# Patient Record
Sex: Male | Born: 1995 | Race: White | Hispanic: No | Marital: Single | Smoking: Current every day smoker
Health system: Southern US, Community
[De-identification: ages and names within clinical notes are randomized; demographics above are authoritative.]

---

## 2016-06-21 ENCOUNTER — Encounter (HOSPITAL_COMMUNITY): Payer: Self-pay | Admitting: Emergency Medicine

## 2016-06-21 ENCOUNTER — Emergency Department (HOSPITAL_COMMUNITY): Payer: Self-pay

## 2016-06-21 ENCOUNTER — Inpatient Hospital Stay (HOSPITAL_COMMUNITY)
Admission: EM | Admit: 2016-06-21 | Discharge: 2016-06-23 | DRG: 493 | Disposition: A | Discharge status: Home / Self Care | Payer: Self-pay | Attending: Orthopaedic Surgery | Admitting: Orthopaedic Surgery

## 2016-06-21 DIAGNOSIS — Z9889 Other specified postprocedural states: Secondary | ICD-10-CM

## 2016-06-21 DIAGNOSIS — Z79899 Other long term (current) drug therapy: Secondary | ICD-10-CM

## 2016-06-21 DIAGNOSIS — S82221A Displaced transverse fracture of shaft of right tibia, initial encounter for closed fracture: Secondary | ICD-10-CM

## 2016-06-21 DIAGNOSIS — S82831A Other fracture of upper and lower end of right fibula, initial encounter for closed fracture: Secondary | ICD-10-CM | POA: Diagnosis present

## 2016-06-21 DIAGNOSIS — D62 Acute posthemorrhagic anemia: Secondary | ICD-10-CM | POA: Diagnosis not present

## 2016-06-21 DIAGNOSIS — S82402A Unspecified fracture of shaft of left fibula, initial encounter for closed fracture: Secondary | ICD-10-CM | POA: Diagnosis present

## 2016-06-21 DIAGNOSIS — Z888 Allergy status to other drugs, medicaments and biological substances status: Secondary | ICD-10-CM

## 2016-06-21 DIAGNOSIS — Y9351 Activity, roller skating (inline) and skateboarding: Secondary | ICD-10-CM

## 2016-06-21 DIAGNOSIS — S82241A Displaced spiral fracture of shaft of right tibia, initial encounter for closed fracture: Principal | ICD-10-CM | POA: Diagnosis present

## 2016-06-21 DIAGNOSIS — F17210 Nicotine dependence, cigarettes, uncomplicated: Secondary | ICD-10-CM | POA: Diagnosis present

## 2016-06-21 DIAGNOSIS — Z419 Encounter for procedure for purposes other than remedying health state, unspecified: Secondary | ICD-10-CM

## 2016-06-21 LAB — CBC WITH DIFFERENTIAL/PLATELET
BASOS ABS: 0 10*3/uL (ref 0.0–0.1)
BASOS PCT: 0 %
Eosinophils Absolute: 0 10*3/uL (ref 0.0–0.7)
Eosinophils Relative: 0 %
HEMATOCRIT: 37.1 % — AB (ref 39.0–52.0)
HEMOGLOBIN: 13.1 g/dL (ref 13.0–17.0)
Lymphocytes Relative: 12 %
Lymphs Abs: 1.5 10*3/uL (ref 0.7–4.0)
MCH: 29.8 pg (ref 26.0–34.0)
MCHC: 35.3 g/dL (ref 30.0–36.0)
MCV: 84.3 fL (ref 78.0–100.0)
MONO ABS: 0.7 10*3/uL (ref 0.1–1.0)
Monocytes Relative: 6 %
NEUTROS ABS: 10.7 10*3/uL — AB (ref 1.7–7.7)
NEUTROS PCT: 82 %
Platelets: 204 10*3/uL (ref 150–400)
RBC: 4.4 MIL/uL (ref 4.22–5.81)
RDW: 13.2 % (ref 11.5–15.5)
WBC: 12.9 10*3/uL — AB (ref 4.0–10.5)

## 2016-06-21 LAB — BASIC METABOLIC PANEL
ANION GAP: 7 (ref 5–15)
BUN: 15 mg/dL (ref 6–20)
CALCIUM: 8.6 mg/dL — AB (ref 8.9–10.3)
CO2: 25 mmol/L (ref 22–32)
Chloride: 105 mmol/L (ref 101–111)
Creatinine, Ser: 0.96 mg/dL (ref 0.61–1.24)
GFR calc non Af Amer: >60 mL/min (ref >60)
Glucose, Bld: 112 mg/dL — ABNORMAL HIGH (ref 65–99)
Potassium: 3.9 mmol/L (ref 3.5–5.1)
Sodium: 137 mmol/L (ref 135–145)

## 2016-06-21 MED ORDER — OXYCODONE HCL 5 MG PO TABS
5.0000 mg | ORAL_TABLET | ORAL | Status: DC | PRN
  Administered 2016-06-22 (×3): 10 mg via ORAL
  Filled 2016-06-21 (×3): qty 2

## 2016-06-21 MED ORDER — HYDROMORPHONE HCL 1 MG/ML IJ SOLN
1.0000 mg | Freq: Once | INTRAMUSCULAR | Status: AC
Start: 2016-06-21 — End: 2016-06-21
  Administered 2016-06-21: 1 mg via INTRAVENOUS
  Filled 2016-06-21: qty 1

## 2016-06-21 MED ORDER — METHOCARBAMOL 500 MG PO TABS
500.0000 mg | ORAL_TABLET | Freq: Four times a day (QID) | ORAL | Status: DC | PRN
  Administered 2016-06-21 – 2016-06-23 (×4): 500 mg via ORAL
  Filled 2016-06-21 (×3): qty 1

## 2016-06-21 MED ORDER — ACETAMINOPHEN 325 MG PO TABS
650.0000 mg | ORAL_TABLET | Freq: Four times a day (QID) | ORAL | Status: DC | PRN
  Administered 2016-06-22: 650 mg via ORAL
  Filled 2016-06-21: qty 2

## 2016-06-21 MED ORDER — ONDANSETRON HCL 4 MG/2ML IJ SOLN
4.0000 mg | Freq: Once | INTRAMUSCULAR | Status: AC
  Administered 2016-06-21: 4 mg via INTRAVENOUS
  Filled 2016-06-21: qty 2

## 2016-06-21 MED ORDER — METHOCARBAMOL 1000 MG/10ML IJ SOLN
500.0000 mg | Freq: Four times a day (QID) | INTRAVENOUS | Status: DC | PRN
  Filled 2016-06-21: qty 5

## 2016-06-21 MED ORDER — FENTANYL CITRATE (PF) 100 MCG/2ML IJ SOLN
100.0000 ug | Freq: Once | INTRAMUSCULAR | Status: AC
  Administered 2016-06-21: 100 ug via INTRAVENOUS
  Filled 2016-06-21: qty 2

## 2016-06-21 MED ORDER — ACETAMINOPHEN 650 MG RE SUPP: 650.0000 mg | Freq: Four times a day (QID) | RECTAL | Status: DC | PRN

## 2016-06-21 MED ORDER — MORPHINE SULFATE (PF) 2 MG/ML IV SOLN
1.0000 mg | INTRAVENOUS | Status: DC | PRN
  Administered 2016-06-22: 1 mg via INTRAVENOUS
  Filled 2016-06-21: qty 1

## 2016-06-21 MED ORDER — METHOCARBAMOL 500 MG PO TABS
ORAL_TABLET | ORAL | Status: AC
  Filled 2016-06-21: qty 1

## 2016-06-21 NOTE — ED Notes (Signed)
Bed: [id number] Expected date:  Expected time:  Means of arrival:  Comments: 20 yo M/lower leg deformity

## 2016-06-21 NOTE — ED Provider Notes (Signed)
[facility]-EMERGENCY DEPT Provider Note   CSN: [id number] Arrival date & time: 06/21/16  2132     History   Chief Complaint Chief Complaint  Patient presents with  . Leg Injury    HPI Luis Vega is a 20 y.o. male.  20 yo M with a chief complaint of right ankle pain. This occurred when the patient tried to do a trick jumping on a skateboard [place] A-frame. He landed on the lateral aspect of the skateboard and inverted his right ankle. He then fell to his knees. Noted a deformity to the ankle. Had some swelling to the area as well. Called 911.   The history is provided by the patient.  Injury  This is a new problem. The current episode started less than 1 hour ago. The problem occurs constantly. The problem has not changed since onset.Pertinent negatives include no chest pain, no abdominal pain, no headaches and no shortness of breath. The symptoms are aggravated by bending, twisting, coughing and walking. Nothing relieves the symptoms. He has tried nothing for the symptoms. The treatment provided no relief.    History reviewed. No pertinent past medical history.  Patient Active Problem List   Diagnosis Date Noted  . Displaced spiral fracture of shaft of right tibia, initial encounter for closed fracture 06/21/2016    History reviewed. No pertinent surgical history.     Home Medications    Prior to Admission medications   Medication Sig Start Date End Date Taking? Authorizing Provider  sodium chloride (OCEAN) 0.65 % SOLN nasal spray Place 1 spray into both nostrils daily.   Yes Historical Provider, MD    Family History History reviewed. No pertinent family history.  Social History Social History  Substance Use Topics  . Smoking status: Current Every Day Smoker    Types: Cigarettes  . Smokeless tobacco: Never Used  . Alcohol use Yes     Allergies   Other   Review of Systems Review of Systems  Constitutional: Negative for chills and fever.  HENT: Negative for  congestion and facial swelling.   Eyes: Negative for discharge and visual disturbance.  Respiratory: Negative for shortness of breath.   Cardiovascular: Negative for chest pain and palpitations.  Gastrointestinal: Negative for abdominal pain, diarrhea and vomiting.  Musculoskeletal: Positive for arthralgias and myalgias.  Skin: Negative for color change and rash.  Neurological: Negative for tremors, syncope and headaches.  Psychiatric/Behavioral: Negative for confusion and dysphoric mood.     Physical Exam Updated Vital Signs BP 139/93   Pulse (!) 47   Ht 5\' 9"  (1.753 m)   Wt 135 lb (61.2 kg)   SpO2 100%   BMI 19.94 kg/m   Physical Exam  Constitutional: He is oriented to person, place, and time. He appears well-developed and well-nourished.  HENT:  Head: Normocephalic and atraumatic.  Eyes: EOM are normal. Pupils are equal, round, and reactive to light.  Neck: Normal range of motion. Neck supple. No JVD present.  Cardiovascular: Normal rate and regular rhythm.  Exam reveals no gallop and no friction rub.   No murmur heard. Pulmonary/Chest: No respiratory distress. He has no wheezes.  Abdominal: He exhibits no distension. There is no rebound and no guarding.  Musculoskeletal: Normal range of motion. He exhibits edema, tenderness and deformity.  Crepitus noted to the distal fibula. Patient with a externally rotated foot compared to the lower leg. Pulse motor and sensation is intact distally. Some edema about the ankle. Patient with pain upon compressing the fibular head  to the tibia. Palpated from head to toe with no other noted injuries.  Neurological: He is alert and oriented to person, place, and time.  Skin: No rash noted. No pallor.  Psychiatric: He has a normal mood and affect. His behavior is normal.  Nursing note and vitals reviewed.    ED Treatments / Results  Labs (all labs ordered are listed, but only abnormal results are displayed) Labs Reviewed  CBC WITH  DIFFERENTIAL/PLATELET - Abnormal; Notable for the following:       Result Value   WBC 12.9 (*)    HCT 37.1 (*)    Neutro Abs 10.7 (*)    All other components within normal limits  BASIC METABOLIC PANEL    EKG  EKG Interpretation None       Radiology Dg Tibia/fibula Right  Result Date: 06/21/2016 CLINICAL DATA:  Initial evaluation for acute trauma, skateboarding accident. Pain and swelling at lower leg. EXAM: RIGHT TIBIA AND FIBULA - 2 VIEW COMPARISON:  None. FINDINGS: There are is an acute oblique fracture through the distal tibial shaft. Lateral displacement of nearly 1 shaft width. No significant angulation. Additional acute oblique fracture through the distal fibular shaft with lateral displacement. Overlying soft tissue swelling.  No soft tissue emphysema. IMPRESSION: Acute oblique fractures through the distal tibial and fibular shafts with lateral displacement. Electronically Signed   By: Rise MuBenjamin  McClintock M.D.   On: 06/21/2016 22:35   Dg Ankle Complete Right  Result Date: 06/21/2016 CLINICAL DATA:  Initial evaluation for acute trauma, skateboarding accident. EXAM: RIGHT ANKLE - COMPLETE 3+ VIEW COMPARISON:  Comparison made with concomitant radiograph of the right tibia and fibula. FINDINGS: There is an acute oblique fracture through the distal tibial shaft with lateral displacement. Additional acute oblique fracture through the distal fibular shaft with lateral displacement. Ankle mortise remains approximated. Talar dome intact. No dislocation about the ankle self. Associated soft tissue swelling present within the lower leg he and about the ankle. IMPRESSION: 1. Acute oblique fracture through the distal tibial and fibular shaft with lateral displacement. 2. No other acute fracture about the ankle. Ankle joint remains approximated. Electronically Signed   By: Rise MuBenjamin  McClintock M.D.   On: 06/21/2016 22:37    Procedures Procedures (including critical care time)  Medications  Ordered in ED Medications  acetaminophen (TYLENOL) tablet 650 mg (not administered)    Or  acetaminophen (TYLENOL) suppository 650 mg (not administered)  oxyCODONE (Oxy IR/ROXICODONE) immediate release tablet 5-10 mg (not administered)  morphine 2 MG/ML injection 1 mg (not administered)  methocarbamol (ROBAXIN) tablet 500 mg (not administered)    Or  methocarbamol (ROBAXIN) 500 mg in dextrose 5 % 50 mL IVPB (not administered)  methocarbamol (ROBAXIN) 500 MG tablet (not administered)  ondansetron (ZOFRAN) injection 4 mg (4 mg Intravenous Given 06/21/16 2155)  fentaNYL (SUBLIMAZE) injection 100 mcg (100 mcg Intravenous Given 06/21/16 2155)  fentaNYL (SUBLIMAZE) injection 100 mcg (100 mcg Intravenous Given 06/21/16 2236)  HYDROmorphone (DILAUDID) injection 1 mg (1 mg Intravenous Given 06/21/16 2312)     Initial Impression / Assessment and Plan / ED Course  I have reviewed the triage vital signs and the nursing notes.  Pertinent labs & imaging results that were available during my care of the patient were reviewed by me and considered in my medical decision making (see chart for details).  Clinical Course     20 yo M With a chief complaint of right ankle pain. This appears to clinically be broken. Will obtain a plain  film of the ankle and tib-fib.  Patient with a distal tibial fracture in distal fibular fracture. Discussed with Dr. Roda ShuttersXu, ortho.  Recommended that we put the patient in a splint and he will admit. OR tomorrow.  The patients results and plan were reviewed and discussed.   Any x-rays performed were independently reviewed by myself.   Differential diagnosis were considered with the presenting HPI.  Medications  acetaminophen (TYLENOL) tablet 650 mg (not administered)    Or  acetaminophen (TYLENOL) suppository 650 mg (not administered)  oxyCODONE (Oxy IR/ROXICODONE) immediate release tablet 5-10 mg (not administered)  morphine 2 MG/ML injection 1 mg (not administered)    methocarbamol (ROBAXIN) tablet 500 mg (not administered)    Or  methocarbamol (ROBAXIN) 500 mg in dextrose 5 % 50 mL IVPB (not administered)  methocarbamol (ROBAXIN) 500 MG tablet (not administered)  ondansetron (ZOFRAN) injection 4 mg (4 mg Intravenous Given 06/21/16 2155)  fentaNYL (SUBLIMAZE) injection 100 mcg (100 mcg Intravenous Given 06/21/16 2155)  fentaNYL (SUBLIMAZE) injection 100 mcg (100 mcg Intravenous Given 06/21/16 2236)  HYDROmorphone (DILAUDID) injection 1 mg (1 mg Intravenous Given 06/21/16 2312)    Vitals:   06/21/16 2139 06/21/16 2200 06/21/16 2230  BP:  127/84 139/93  Pulse:  (!) 52 (!) 47  SpO2:  99% 100%  Weight: 135 lb (61.2 kg)    Height: 5\' 9"  (1.753 m)      Final diagnoses:  Closed displaced transverse fracture of shaft of right tibia, initial encounter    Admission/ observation were discussed with the admitting physician, patient and/or family and they are comfortable with the plan.    Final Clinical Impressions(s) / ED Diagnoses   Final diagnoses:  Closed displaced transverse fracture of shaft of right tibia, initial encounter    New Prescriptions New Prescriptions   No medications on file     Melene PlanDan Zanita Millman, DO 06/21/16 2324

## 2016-06-21 NOTE — ED Triage Notes (Addendum)
[place] with c/oright leg pain after his skating crash tonight.  Pt has had immediate pain to left lower leg/ankle after his skating fall.  Pt's right lower leg and ankle swollen and tender and with noticeable deformity by EMS--- splint applied by EMS; was given a total of Fentanyl 250 mcg IV en route.  Pt denies hitting head or loss of consciousness during his skate crash.

## 2016-06-22 ENCOUNTER — Inpatient Hospital Stay (HOSPITAL_COMMUNITY): Payer: Self-pay | Admitting: Certified Registered Nurse Anesthetist

## 2016-06-22 ENCOUNTER — Encounter (HOSPITAL_COMMUNITY)
Admission: EM | Disposition: A | Discharge status: Home / Self Care | Payer: Self-pay | Source: Home / Self Care | Attending: Orthopaedic Surgery

## 2016-06-22 ENCOUNTER — Inpatient Hospital Stay: Admit: 2016-06-22 | Payer: Self-pay | Admitting: Orthopaedic Surgery

## 2016-06-22 ENCOUNTER — Inpatient Hospital Stay (HOSPITAL_COMMUNITY): Payer: Self-pay

## 2016-06-22 ENCOUNTER — Encounter (HOSPITAL_COMMUNITY): Payer: Self-pay | Admitting: Certified Registered Nurse Anesthetist

## 2016-06-22 DIAGNOSIS — S82241A Displaced spiral fracture of shaft of right tibia, initial encounter for closed fracture: Principal | ICD-10-CM

## 2016-06-22 HISTORY — PX: TIBIA IM NAIL INSERTION: SHX2516

## 2016-06-22 SURGERY — INSERTION, INTRAMEDULLARY ROD, TIBIA
Anesthesia: General | Laterality: Right

## 2016-06-22 MED ORDER — DIPHENHYDRAMINE HCL 12.5 MG/5ML PO ELIX: 25.0000 mg | ORAL_SOLUTION | ORAL | Status: DC | PRN

## 2016-06-22 MED ORDER — ASPIRIN EC 325 MG PO TBEC: 325.0000 mg | DELAYED_RELEASE_TABLET | Freq: Every day | ORAL | 0 refills | Status: AC

## 2016-06-22 MED ORDER — PROPOFOL 10 MG/ML IV BOLUS
INTRAVENOUS | Status: AC
Start: 2016-06-22 — End: 2016-06-22
  Filled 2016-06-22: qty 20

## 2016-06-22 MED ORDER — MAGNESIUM CITRATE PO SOLN: 1.0000 | Freq: Once | ORAL | Status: DC | PRN

## 2016-06-22 MED ORDER — ACETAMINOPHEN 325 MG PO TABS: 650.0000 mg | ORAL_TABLET | Freq: Four times a day (QID) | ORAL | Status: DC | PRN

## 2016-06-22 MED ORDER — CEFAZOLIN SODIUM-DEXTROSE 2-4 GM/100ML-% IV SOLN
2.0000 g | Freq: Four times a day (QID) | INTRAVENOUS | Status: AC
  Administered 2016-06-22 – 2016-06-23 (×3): 2 g via INTRAVENOUS
  Filled 2016-06-22 (×3): qty 100

## 2016-06-22 MED ORDER — SENNOSIDES-DOCUSATE SODIUM 8.6-50 MG PO TABS: 1.0000 | ORAL_TABLET | Freq: Every evening | ORAL | 1 refills | Status: AC | PRN

## 2016-06-22 MED ORDER — MIDAZOLAM HCL 5 MG/5ML IJ SOLN
INTRAMUSCULAR | Status: DC | PRN
  Administered 2016-06-22: 2 mg via INTRAVENOUS

## 2016-06-22 MED ORDER — ALBUTEROL SULFATE HFA 108 (90 BASE) MCG/ACT IN AERS
INHALATION_SPRAY | RESPIRATORY_TRACT | Status: AC
  Filled 2016-06-22: qty 6.7

## 2016-06-22 MED ORDER — ACETAMINOPHEN 10 MG/ML IV SOLN
INTRAVENOUS | Status: DC | PRN
  Administered 2016-06-22: 1000 mg via INTRAVENOUS

## 2016-06-22 MED ORDER — KETOROLAC TROMETHAMINE 30 MG/ML IJ SOLN
INTRAMUSCULAR | Status: DC | PRN
  Administered 2016-06-22: 30 mg via INTRAVENOUS

## 2016-06-22 MED ORDER — ONDANSETRON HCL 4 MG/2ML IJ SOLN: 4.0000 mg | Freq: Four times a day (QID) | INTRAMUSCULAR | Status: DC | PRN

## 2016-06-22 MED ORDER — HYDROMORPHONE HCL 2 MG/ML IJ SOLN
INTRAMUSCULAR | Status: AC
  Filled 2016-06-22: qty 1

## 2016-06-22 MED ORDER — KETOROLAC TROMETHAMINE 30 MG/ML IJ SOLN: 30.0000 mg | Freq: Once | INTRAMUSCULAR | Status: DC | PRN

## 2016-06-22 MED ORDER — ACETAMINOPHEN 10 MG/ML IV SOLN
INTRAVENOUS | Status: AC
  Filled 2016-06-22: qty 100

## 2016-06-22 MED ORDER — METOCLOPRAMIDE HCL 5 MG/ML IJ SOLN: 5.0000 mg | Freq: Three times a day (TID) | INTRAMUSCULAR | Status: DC | PRN

## 2016-06-22 MED ORDER — ACETAMINOPHEN 650 MG RE SUPP: 650.0000 mg | Freq: Four times a day (QID) | RECTAL | Status: DC | PRN

## 2016-06-22 MED ORDER — METHOCARBAMOL 500 MG PO TABS: 500.0000 mg | ORAL_TABLET | Freq: Four times a day (QID) | ORAL | Status: DC | PRN

## 2016-06-22 MED ORDER — OXYCODONE HCL 5 MG PO TABS: 5.0000 mg | ORAL_TABLET | ORAL | 0 refills | Status: AC | PRN

## 2016-06-22 MED ORDER — PROMETHAZINE HCL 25 MG/ML IJ SOLN: 6.2500 mg | INTRAMUSCULAR | Status: DC | PRN

## 2016-06-22 MED ORDER — ONDANSETRON HCL 4 MG PO TABS: 4.0000 mg | ORAL_TABLET | Freq: Three times a day (TID) | ORAL | 0 refills | Status: AC | PRN

## 2016-06-22 MED ORDER — ROCURONIUM BROMIDE 100 MG/10ML IV SOLN
INTRAVENOUS | Status: DC | PRN
  Administered 2016-06-22: 50 mg via INTRAVENOUS

## 2016-06-22 MED ORDER — 0.9 % SODIUM CHLORIDE (POUR BTL) OPTIME
TOPICAL | Status: DC | PRN
  Administered 2016-06-22: 300 mL

## 2016-06-22 MED ORDER — MORPHINE SULFATE (PF) 2 MG/ML IV SOLN: 1.0000 mg | INTRAVENOUS | Status: DC | PRN

## 2016-06-22 MED ORDER — ONDANSETRON HCL 4 MG/2ML IJ SOLN
INTRAMUSCULAR | Status: DC | PRN
  Administered 2016-06-22: 4 mg via INTRAVENOUS

## 2016-06-22 MED ORDER — MIDAZOLAM HCL 2 MG/2ML IJ SOLN
INTRAMUSCULAR | Status: AC
  Filled 2016-06-22: qty 2

## 2016-06-22 MED ORDER — LACTATED RINGERS IV SOLN
INTRAVENOUS | Status: DC | PRN
  Administered 2016-06-22: 09:00:00 via INTRAVENOUS

## 2016-06-22 MED ORDER — METHOCARBAMOL 750 MG PO TABS: 750.0000 mg | ORAL_TABLET | Freq: Two times a day (BID) | ORAL | 0 refills | Status: AC | PRN

## 2016-06-22 MED ORDER — FENTANYL CITRATE (PF) 100 MCG/2ML IJ SOLN
INTRAMUSCULAR | Status: DC | PRN
  Administered 2016-06-22 (×3): 100 ug via INTRAVENOUS

## 2016-06-22 MED ORDER — DEXTROSE 5 % IV SOLN: 500.0000 mg | Freq: Four times a day (QID) | INTRAVENOUS | Status: DC | PRN

## 2016-06-22 MED ORDER — SODIUM CHLORIDE 0.9 % IV SOLN
INTRAVENOUS | Status: DC
  Administered 2016-06-22 (×2): via INTRAVENOUS

## 2016-06-22 MED ORDER — OXYCODONE HCL 5 MG PO TABS
5.0000 mg | ORAL_TABLET | ORAL | Status: DC | PRN
  Administered 2016-06-23 (×3): 10 mg via ORAL
  Filled 2016-06-22 (×3): qty 2

## 2016-06-22 MED ORDER — PROMETHAZINE HCL 25 MG PO TABS: 25.0000 mg | ORAL_TABLET | Freq: Four times a day (QID) | ORAL | 1 refills | Status: AC | PRN

## 2016-06-22 MED ORDER — SUGAMMADEX SODIUM 200 MG/2ML IV SOLN
INTRAVENOUS | Status: DC | PRN
  Administered 2016-06-22: 150 mg via INTRAVENOUS

## 2016-06-22 MED ORDER — SORBITOL 70 % SOLN: 30.0000 mL | Freq: Every day | Status: DC | PRN

## 2016-06-22 MED ORDER — KETOROLAC TROMETHAMINE 30 MG/ML IJ SOLN: 30.0000 mg | Freq: Four times a day (QID) | INTRAMUSCULAR | Status: DC | PRN

## 2016-06-22 MED ORDER — FENTANYL CITRATE (PF) 100 MCG/2ML IJ SOLN
INTRAMUSCULAR | Status: AC
  Filled 2016-06-22: qty 4

## 2016-06-22 MED ORDER — FENTANYL CITRATE (PF) 100 MCG/2ML IJ SOLN
INTRAMUSCULAR | Status: AC
  Filled 2016-06-22: qty 2

## 2016-06-22 MED ORDER — ONDANSETRON HCL 4 MG PO TABS: 4.0000 mg | ORAL_TABLET | Freq: Four times a day (QID) | ORAL | Status: DC | PRN

## 2016-06-22 MED ORDER — POLYETHYLENE GLYCOL 3350 17 G PO PACK: 17.0000 g | PACK | Freq: Every day | ORAL | Status: DC | PRN

## 2016-06-22 MED ORDER — DEXAMETHASONE SODIUM PHOSPHATE 10 MG/ML IJ SOLN
INTRAMUSCULAR | Status: DC | PRN
  Administered 2016-06-22: 8 mg via INTRAVENOUS

## 2016-06-22 MED ORDER — HYDROMORPHONE HCL 1 MG/ML IJ SOLN
0.2500 mg | INTRAMUSCULAR | Status: DC | PRN
  Administered 2016-06-22 (×2): 0.5 mg via INTRAVENOUS

## 2016-06-22 MED ORDER — CEFAZOLIN SODIUM-DEXTROSE 2-4 GM/100ML-% IV SOLN
INTRAVENOUS | Status: AC
  Filled 2016-06-22: qty 100

## 2016-06-22 MED ORDER — METOCLOPRAMIDE HCL 5 MG PO TABS: 5.0000 mg | ORAL_TABLET | Freq: Three times a day (TID) | ORAL | Status: DC | PRN

## 2016-06-22 MED ORDER — PROPOFOL 10 MG/ML IV BOLUS
INTRAVENOUS | Status: DC | PRN
  Administered 2016-06-22: 150 mg via INTRAVENOUS

## 2016-06-22 MED ORDER — ASPIRIN EC 325 MG PO TBEC
325.0000 mg | DELAYED_RELEASE_TABLET | Freq: Every day | ORAL | Status: DC
  Administered 2016-06-22 – 2016-06-23 (×2): 325 mg via ORAL
  Filled 2016-06-22: qty 1

## 2016-06-22 MED ORDER — PROPOFOL 10 MG/ML IV BOLUS
INTRAVENOUS | Status: AC
  Filled 2016-06-22: qty 20

## 2016-06-22 MED ORDER — LIDOCAINE HCL (CARDIAC) 20 MG/ML IV SOLN
INTRAVENOUS | Status: DC | PRN
  Administered 2016-06-22: 60 mg via INTRAVENOUS

## 2016-06-22 SURGICAL SUPPLY — 58 items
BANDAGE ACE 4X5 VEL STRL LF (GAUZE/BANDAGES/DRESSINGS) ×3 IMPLANT
BANDAGE ACE 6X5 VEL STRL LF (GAUZE/BANDAGES/DRESSINGS) ×3 IMPLANT
BANDAGE ESMARK 6X9 LF (GAUZE/BANDAGES/DRESSINGS) ×1 IMPLANT
BIT DRILL AO GAMMA 4.2X130 (BIT) ×3 IMPLANT
BIT DRILL AO GAMMA 4.2X180 (BIT) ×3 IMPLANT
BIT DRILL AO GAMMA 4.2X340 (BIT) ×3 IMPLANT
BNDG ESMARK 6X9 LF (GAUZE/BANDAGES/DRESSINGS) ×3
COVER MAYO STAND STRL (DRAPES) IMPLANT
COVER SURGICAL LIGHT HANDLE (MISCELLANEOUS) ×3 IMPLANT
CUFF TOURNIQUET SINGLE 24IN (TOURNIQUET CUFF) ×3 IMPLANT
CUFF TOURNIQUET SINGLE 34IN LL (TOURNIQUET CUFF) IMPLANT
DRAPE C-ARM 42X72 X-RAY (DRAPES) ×3 IMPLANT
DRAPE C-ARMOR (DRAPES) ×3 IMPLANT
DRAPE EXTREMITY T 121X128X90 (DRAPE) ×3 IMPLANT
DRAPE HALF SHEET 40X57 (DRAPES) ×3 IMPLANT
DRAPE IMP U-DRAPE 54X76 (DRAPES) ×3 IMPLANT
DRAPE POUCH INSTRU U-SHP 10X18 (DRAPES) IMPLANT
DRAPE U-SHAPE 47X51 STRL (DRAPES) IMPLANT
DRAPE UTILITY XL STRL (DRAPES) ×6 IMPLANT
DRSG PAD ABDOMINAL 8X10 ST (GAUZE/BANDAGES/DRESSINGS) ×3 IMPLANT
DURAPREP 26ML APPLICATOR (WOUND CARE) ×3 IMPLANT
ELECT CAUTERY BLADE 6.4 (BLADE) ×3 IMPLANT
ELECT REM PT RETURN 9FT ADLT (ELECTROSURGICAL) ×3
ELECTRODE REM PT RTRN 9FT ADLT (ELECTROSURGICAL) ×1 IMPLANT
END CAP TIBIAL T2 11.5X10 (Cap) ×3 IMPLANT
FACESHIELD WRAPAROUND (MASK) IMPLANT
GAUZE SPONGE 4X4 12PLY STRL (GAUZE/BANDAGES/DRESSINGS) ×3 IMPLANT
GAUZE XEROFORM 1X8 LF (GAUZE/BANDAGES/DRESSINGS) ×3 IMPLANT
GLOVE SKINSENSE NS SZ7.5 (GLOVE) ×8
GLOVE SKINSENSE STRL SZ7.5 (GLOVE) ×4 IMPLANT
GOWN STRL REIN XL XLG (GOWN DISPOSABLE) ×3 IMPLANT
GUIDEROD T2 3X1000 (ROD) ×3 IMPLANT
GUIDEWIRE GAMMA (WIRE) ×6 IMPLANT
K-WIRE FIXATION 3X285 COATED (WIRE) ×6
KIT BASIN OR (CUSTOM PROCEDURE TRAY) ×3 IMPLANT
KWIRE FIXATION 3X285 COATED (WIRE) ×2 IMPLANT
MANIFOLD NEPTUNE II (INSTRUMENTS) ×3 IMPLANT
NAIL ELAS INSERT SLV SPI 8-11 ×2 IMPLANT
NAIL INSERTION SLEEVE ×3 IMPLANT
NS IRRIG 1000ML POUR BTL (IV SOLUTION) ×3 IMPLANT
PACK TOTAL JOINT (CUSTOM PROCEDURE TRAY) ×3 IMPLANT
PACK UNIVERSAL I (CUSTOM PROCEDURE TRAY) ×3 IMPLANT
PAD CAST 4YDX4 CTTN HI CHSV (CAST SUPPLIES) IMPLANT
PADDING CAST COTTON 4X4 STRL (CAST SUPPLIES)
PADDING CAST COTTON 6X4 STRL (CAST SUPPLIES) ×3 IMPLANT
SCREW LOCKING T2 F/T  5X37.5MM (Screw) ×2 IMPLANT
SCREW LOCKING T2 F/T  5X42.5MM (Screw) ×2 IMPLANT
SCREW LOCKING T2 F/T 5X37.5MM (Screw) ×1 IMPLANT
SCREW LOCKING T2 F/T 5X42.5MM (Screw) ×1 IMPLANT
SCREW LOCKING THREADED 5X47.5 (Screw) ×6 IMPLANT
SPONGE GAUZE 4X4 12PLY STER LF (GAUZE/BANDAGES/DRESSINGS) ×3 IMPLANT
STAPLER SKIN PROX WIDE 3.9 (STAPLE) ×3 IMPLANT
SUT VIC AB 0 CT1 27 (SUTURE) ×4
SUT VIC AB 0 CT1 27XBRD ANTBC (SUTURE) ×2 IMPLANT
SUT VIC AB 2-0 CT1 27 (SUTURE) ×4
SUT VIC AB 2-0 CT1 TAPERPNT 27 (SUTURE) ×2 IMPLANT
TOWEL OR 17X26 10 PK STRL BLUE (TOWEL DISPOSABLE) ×6 IMPLANT
WATER STERILE IRR 1000ML POUR (IV SOLUTION) IMPLANT

## 2016-06-22 NOTE — ED Notes (Signed)
Patient transported to CT 

## 2016-06-22 NOTE — Discharge Instructions (Signed)
° ° °  1. Keep dressings clean and dry °2. Elevate foot above level of the heart °3. Take aspirin to prevent blood clots °4. Take pain meds as needed °5. Strict non weight bearing to operative extremity ° °

## 2016-06-22 NOTE — Anesthesia Preprocedure Evaluation (Signed)
Anesthesia Evaluation  Patient identified by MRN, date of birth, ID band Patient awake    Reviewed: Allergy & Precautions, NPO status , Patient's Chart, lab work & pertinent test results  Airway Mallampati: II  TM Distance: >3 FB Neck ROM: Full    Dental no notable dental hx.    Pulmonary Current Smoker,    Pulmonary exam normal breath sounds clear to auscultation       Cardiovascular negative cardio ROS Normal cardiovascular exam Rhythm:Regular Rate:Normal     Neuro/Psych negative neurological ROS  negative psych ROS   GI/Hepatic negative GI ROS, Neg liver ROS,   Endo/Other  negative endocrine ROS  Renal/GU negative Renal ROS  negative genitourinary   Musculoskeletal negative musculoskeletal ROS (+)   Abdominal   Peds negative pediatric ROS (+)  Hematology negative hematology ROS (+)   Anesthesia Other Findings   Reproductive/Obstetrics negative OB ROS                             Anesthesia Physical Anesthesia Plan  ASA: II  Anesthesia Plan: General   Post-op Pain Management:    Induction: Intravenous  Airway Management Planned: Oral ETT  Additional Equipment:   Intra-op Plan:   Post-operative Plan: Extubation in OR  Informed Consent: I have reviewed the patients History and Physical, chart, labs and discussed the procedure including the risks, benefits and alternatives for the proposed anesthesia with the patient or authorized representative who has indicated his/her understanding and acceptance.   Dental advisory given  Plan Discussed with: CRNA and Surgeon  Anesthesia Plan Comments:         Anesthesia Quick Evaluation  

## 2016-06-22 NOTE — Evaluation (Signed)
Physical Therapy Evaluation Patient Details Name: Luis Vega MRN: [id number] DOB: Nov 05, 1995 Today's Date: 06/22/2016   History of Present Illness  20 y.o.-year-old malewho was involved in a skateboarding accident with resulting righttibia/fibula fracture. Pt now s/p righttibia closed reduction and intramedullary nailingand closed treatment of left fibular shaft fracture with manipulation. PMH: Lt wrist fx.   Clinical Impression  Pt mobilizing well during initial PT session and anticipating that the pt will continue to improve quickly. Pt reports that he is planning to D/C home to her sister's place. Sister does work and pt will be alone during the day. Pt to continue to follow to maximize independence and safety.     Follow Up Recommendations No PT follow up;Supervision - Intermittent    Equipment Recommendations  Rolling walker with 5" wheels    Recommendations for Other Services       Precautions / Restrictions Precautions Required Braces or Orthoses: Other Brace/Splint Other Brace/Splint: CAM boot Restrictions Weight Bearing Restrictions: Yes RLE Weight Bearing: Non weight bearing      Mobility  Bed Mobility               General bed mobility comments: sitting in chair upon arrival  Transfers Overall transfer level: Needs assistance Equipment used: Rolling walker (2 wheeled) Transfers: Sit to/from Stand Sit to Stand: Min guard         General transfer comment: good technique, using UEs to assist. Consistent with NWB.   Ambulation/Gait Ambulation/Gait assistance: Min guard Ambulation Distance (Feet): 10 Feet Assistive device: Rolling walker (2 wheeled) Gait Pattern/deviations:  (swing-to pattern) Gait velocity: slow pattern   General Gait Details: PT reports walking to bathroom earlier with nursing. Pt consistent with NWB and good stability. Initially only going to stand but pt wanting to attempt to take some steps.   Stairs             Wheelchair Mobility    Modified Rankin (Stroke Patients Only)       Balance Overall balance assessment: Needs assistance Sitting-balance support: No upper extremity supported Sitting balance-Leahy Scale: Good     Standing balance support: Bilateral upper extremity supported Standing balance-Leahy Scale: Poor Standing balance comment: using rw                             Pertinent Vitals/Pain Pain Assessment: 0-10 Pain Score: 5  Pain Location: rt lower leg Pain Descriptors / Indicators: Aching;Operative site guarding Pain Intervention(s): Limited activity within patient's tolerance;Monitored during session;Ice applied    Home Living Family/patient expects to be discharged to:: Private residence Living Arrangements: Other relatives Available Help at Discharge: Family;Available PRN/intermittently Type of Home: House Home Access: Stairs to enter Entrance Stairs-Rails: None Entrance Stairs-Number of Steps: 4 Home Layout: One level Home Equipment: Crutches Additional Comments: pt lives with his sister who works during the day.     Prior Function Level of Independence: Independent               Hand Dominance        Extremity/Trunk Assessment   Upper Extremity Assessment: Overall WFL for tasks assessed           Lower Extremity Assessment: Overall WFL for tasks assessed RLE Deficits / Details: pt guarded with movement involving Rt LE       Communication   Communication: No difficulties  Cognition Arousal/Alertness: Awake/alert Behavior During Therapy: WFL for tasks assessed/performed Overall Cognitive Status: Within Functional Limits for tasks  assessed                      General Comments      Exercises     Assessment/Plan    PT Assessment Patient needs continued PT services  PT Problem List Decreased strength;Decreased range of motion;Decreased activity tolerance;Decreased balance;Decreased mobility;Pain           PT Treatment Interventions DME instruction;Gait training;Stair training;Functional mobility training;Therapeutic activities;Therapeutic exercise;Patient/family education    PT Goals (Current goals can be found in the Care Plan section)  Acute Rehab PT Goals Patient Stated Goal: get this healed and back going again PT Goal Formulation: With patient Time For Goal Achievement: 07/06/16 Potential to Achieve Goals: Good    Frequency Min 3X/week   Barriers to discharge        Co-evaluation               End of Session   Activity Tolerance: Patient tolerated treatment well Patient left: in chair (Rt LE elevated) Nurse Communication: Mobility status         Time: [id number] PT Time Calculation (min) (ACUTE ONLY): 21 min   Charges:   PT Evaluation $PT Eval Low Complexity: 1 Procedure     PT G Codes:        Christiane HaBenjamin J. Gerilynn Mccullars, PT, CSCS Pager [phone] Office [phone]  06/22/2016, 4:13 PM

## 2016-06-22 NOTE — Transfer of Care (Signed)
Immediate Anesthesia Transfer of Care Note  Patient: Luis Vega  Procedure(s) Performed: Procedure(s): INTRAMEDULLARY (IM) NAIL TIBIAL (Right)  Patient Location: PACU  Anesthesia Type:General  Level of Consciousness: awake, alert , oriented and patient cooperative  Airway & Oxygen Therapy: Patient Spontanous Breathing and Patient connected to nasal cannula oxygen  Post-op Assessment: Report given to RN and Post -op Vital signs reviewed and stable  Post vital signs: Reviewed and stable  Last Vitals:  Vitals:   06/22/16 0513 06/22/16 0853  BP: (!) 134/96 132/89  Pulse: (!) 45 (!) 47  Resp: 18 18  Temp: 36.8 C 36.7 C    Last Pain:  Vitals:   06/22/16 0853  TempSrc: Oral  PainSc:       Patients Stated Pain Goal: 4 (06/22/16 0550)  Complications: No apparent anesthesia complications

## 2016-06-22 NOTE — Op Note (Signed)
Date of Surgery: 06/22/2016  INDICATIONS: Mr. Luis Vega is a 20 y.o.-year-old male who was involved in a skateboarding and sustained a right tibia fracture. The risks and benefits of the procedure discussed with the patient prior to the procedure and all questions were answered; consent was obtained.  PREOPERATIVE DIAGNOSIS:  right tibia fracture  POSTOPERATIVE DIAGNOSIS: Same  PROCEDURE:   1. right tibia closed reduction and intramedullary nailing CPT: 27759  2. closed treatment of left fibular shaft fracture with manipulation, CPT - 16109- 27781  SURGEON: N. Glee ArvinMichael Loetta Connelley, M.D.  ASSISTANT: none .  ANESTHESIA:  general  IV FLUIDS AND URINE: See anesthesia record.  ESTIMATED BLOOD LOSS: minimal mL.  IMPLANTS: Stryker 10 x 330   DRAINS: None.  COMPLICATIONS: None.  DESCRIPTION OF PROCEDURE: The patient was brought to the operating room and placed supine on the operating table.  The patient's leg had been signed prior to the procedure.  The patient had the anesthesia placed by the anesthesiologist.  The prep verification and incision time-outs were performed to confirm that this was the correct patient, site, side and location. The patient had an SCD on the opposite lower extremity. The patient did receive antibiotics prior to the incision and was re-dosed during the procedure as needed at indicated intervals.  The patient had the lower extremity prepped and draped in the standard surgical fashion.  The incision was first made over the quadriceps tendon in the midline and taken down to the skin and subcutaneous tissue to expose the peritenon. The peritenon was incised in line with the skin incision and then a poke hole was made in the quadriceps tendon in the midline. A knife was then used to longitudinally divide the tendon in line with its fibers, taking care not to cross over any fibers. The guide wire was placed at the proximal, anterior tibia, confirming its location on both AP and lateral  views. The wire was drilled into the bone and then the opening reamer was placed over this and maneuvered so that the reamer was parallel with anterior cortex of the tibia. The ball-tipped guide wire was then placed down into the canal towards the fracture site. The fracture was reduced and the wire was passed and confirmed to be in the proper location on both AP and lateral views.  The measuring stick was used to measure the length of the nail.  Sequential reaming was then performed, then the nail was gently hammered into place over the guide wire and the guide wire was removed. The proximal screws were placed through the interlocking drill guide using the sleeve. The distal screws were placed using the perfect circles technique. All screws were placed in the standard fashion, first incising the skin and then spreading with a tonsil, then drilling, measuring with a depth gauge, and then placing the screws by hand. The final x-rays were taken in both AP and lateral views to confirm the fracture reduction as well as the placement of all hardware. The fibula fracture was treated in a closed manner.  The wounds were copiously irrigated with saline and then the peritenon was closed with 0 Vicryl figure-of-eight interrupted sutures. 2.0 vicryl was used to close the subcutaneous layer.  Staples were then used to close all of the open incision wounds.  The wounds were cleaned and dried a final time and a sterile dressing was placed. The patient was then placed in a short leg splint in neutral ankle dorsiflexion. The patient's calf was soft to palpation  at the end of the case.  The patient was then transferred to a bed and taken to the recovery room in stable condition.  All counts were correct at the end of the case.  POSTOPERATIVE PLAN: Mr. Luis Vega will be NWB and will return 2 weeks for suture removal.  Mr. Luis Vega will receive DVT prophylaxis.  Luis ReelN. Michael Demetress Tift, MD [facility] [phone] 11:16 AM

## 2016-06-22 NOTE — ED Notes (Signed)
[facility] here to transport pt to [facility]. 

## 2016-06-22 NOTE — Progress Notes (Signed)
Orthopedic Tech Progress Note Patient Details:  Luis Vega [birth year] [id number]  Ortho Devices Type of Ortho Device: CAM walker Splint Material: Plaster Ortho Device/Splint Location: RLE Ortho Device/Splint Interventions: Ordered, Application   Jennye MoccasinHughes, [name] 06/22/2016, 4:37 PM

## 2016-06-22 NOTE — H&P (Signed)
ORTHOPAEDIC HISTORY AND PHYSICAL   Chief Complaint: right tib fib fx  HPI: Kern Reapdam Cork is a 20 y.o. male who complains of right tib fib fx s/p fall while skateboarding.  Pain is isolated to right leg that's worse with movement, better with elevation.  Denies any other injuries.  Denies LOC, neck pain.  Ortho consulted.  Neg past medical hx History reviewed. No pertinent surgical history. Social History   Social History  . Marital status: Single    Spouse name: N/A  . Number of children: N/A  . Years of education: N/A   Social History Main Topics  . Smoking status: Current Every Day Smoker    Types: Cigarettes  . Smokeless tobacco: Never Used  . Alcohol use Yes  . Drug use:     Types: Marijuana  . Sexual activity: Yes   Other Topics Concern  . None   Social History Narrative  . None   History reviewed. No pertinent family history. Allergies  Allergen Reactions  . Other Other (See Comments)    ADHD medication-- reaction unknown (childhood allergy)   Prior to Admission medications   Medication Sig Start Date End Date Taking? Authorizing Provider  sodium chloride (OCEAN) 0.65 % SOLN nasal spray Place 1 spray into both nostrils daily.   Yes Historical Provider, MD   Dg Tibia/fibula Right  Result Date: 06/21/2016 CLINICAL DATA:  Initial evaluation for acute trauma, skateboarding accident. Pain and swelling at lower leg. EXAM: RIGHT TIBIA AND FIBULA - 2 VIEW COMPARISON:  None. FINDINGS: There are is an acute oblique fracture through the distal tibial shaft. Lateral displacement of nearly 1 shaft width. No significant angulation. Additional acute oblique fracture through the distal fibular shaft with lateral displacement. Overlying soft tissue swelling.  No soft tissue emphysema. IMPRESSION: Acute oblique fractures through the distal tibial and fibular shafts with lateral displacement. Electronically Signed   By: Rise MuBenjamin  McClintock M.D.   On: 06/21/2016 22:35   Dg Ankle  Complete Right  Result Date: 06/21/2016 CLINICAL DATA:  Initial evaluation for acute trauma, skateboarding accident. EXAM: RIGHT ANKLE - COMPLETE 3+ VIEW COMPARISON:  Comparison made with concomitant radiograph of the right tibia and fibula. FINDINGS: There is an acute oblique fracture through the distal tibial shaft with lateral displacement. Additional acute oblique fracture through the distal fibular shaft with lateral displacement. Ankle mortise remains approximated. Talar dome intact. No dislocation about the ankle self. Associated soft tissue swelling present within the lower leg he and about the ankle. IMPRESSION: 1. Acute oblique fracture through the distal tibial and fibular shaft with lateral displacement. 2. No other acute fracture about the ankle. Ankle joint remains approximated. Electronically Signed   By: Rise MuBenjamin  McClintock M.D.   On: 06/21/2016 22:37   Ct Ankle Right Wo Contrast  Result Date: 06/22/2016 CLINICAL DATA:  Right ankle pain. Ankle fracture post skating event tonight. EXAM: CT OF THE RIGHT ANKLE WITHOUT CONTRAST TECHNIQUE: Multidetector CT imaging of the right ankle was performed according to the standard protocol. Multiplanar CT image reconstructions were also generated. COMPARISON:  Radiographs 2 hours prior. FINDINGS: Bones/Joint/Cartilage Oblique displaced distal tibial diaphysis all fracture is mildly comminuted. There is approximately a 4 mm displacement. No extension to the tibial plafond. Oblique distal fibular diaphyseal fracture is slightly more distal than the tibial fracture however has greater displacement of 1 cm. Proximal fragment about the anterior tibial cortex distally. There is no intra-articular extension. Osteochondral defect of the medial talar dome appears chronic. The mortise is  preserved. There is no tibial talar joint effusion. Ligaments Suboptimally assessed by CT. Muscles and Tendons No intramuscular hematoma. Muscle bulk is maintained. The Achilles  tendon is intact. No evidence of flexor or extensor entrapment secondary to fractures. Soft tissues Soft tissue edema and probable hematoma anteriorly about the ankle. IMPRESSION: 1. Oblique displaced distal tibia and fibular shaft fractures without intra-articular extension. 2. 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 Electronically Signed   By: Rubye OaksMelanie  Ehinger M.D.   On: 06/22/2016 00:35   - pertinent xrays, CT, MRI studies were reviewed and independently interpreted  Positive ROS: All other systems have been reviewed and were otherwise negative with the exception of those mentioned in the HPI and as above.  Physical Exam: General: Alert, no acute distress Cardiovascular: No pedal edema Respiratory: No cyanosis, no use of accessory musculature GI: No organomegaly, abdomen is soft and non-tender Skin: No lesions in the area of chief complaint Neurologic: Sensation intact distally Psychiatric: Patient is competent for consent with normal mood and affect Lymphatic: No axillary or cervical lymphadenopathy  MUSCULOSKELETAL:  - compartment soft - toes wwp - NVI  Assessment: Right distal tib fib fx  Plan: - to OR today for IMN - consent obtained   N. Glee ArvinMichael Xu, MD [facility] [phone] 8:38 AM

## 2016-06-22 NOTE — Anesthesia Procedure Notes (Signed)
Procedure Name: Intubation Date/Time: 06/22/2016 9:41 AM Performed by: Tillman AbideHAWKINS, Ismael Karge B Pre-anesthesia Checklist: Patient identified, Emergency Drugs available, Suction available and Patient being monitored Patient Re-evaluated:Patient Re-evaluated prior to inductionOxygen Delivery Method: Circle System Utilized Preoxygenation: Pre-oxygenation with 100% oxygen Intubation Type: IV induction Ventilation: Mask ventilation without difficulty Laryngoscope Size: Mac and 3 Grade View: Grade I Tube type: Oral Number of attempts: 1 Airway Equipment and Method: Stylet Placement Confirmation: ETT inserted through vocal cords under direct vision,  positive ETCO2 and breath sounds checked- equal and bilateral Secured at: 23 cm Tube secured with: Tape Dental Injury: Teeth and Oropharynx as per pre-operative assessment

## 2016-06-22 NOTE — ED Notes (Signed)
[facility] was notified of pt's need of transportation to [facility]. 

## 2016-06-23 MED ORDER — NICOTINE 7 MG/24HR TD PT24
7.0000 mg | MEDICATED_PATCH | Freq: Every day | TRANSDERMAL | Status: DC
  Administered 2016-06-23: 7 mg via TRANSDERMAL
  Filled 2016-06-23: qty 1

## 2016-06-23 NOTE — Progress Notes (Signed)
   Subjective:  Patient reports pain as mild.    Objective:   VITALS:   Vitals:   06/22/16 1230 06/22/16 2055 06/22/16 2311 06/23/16 0427  BP: 137/87 130/80 116/63 129/60  Pulse:  (!) 47 (!) 45 (!) 52  Resp: 12     Temp: 97.7 F (36.5 C) 98 F (36.7 C)  98.3 F (36.8 C)  TempSrc:  Oral  Oral  SpO2: 97% 99%  100%  Weight:      Height:        Neurologically intact Neurovascular intact Sensation intact distally Intact pulses distally Dorsiflexion/Plantar flexion intact Incision: dressing C/D/I and no drainage No cellulitis present Compartment soft   Lab Results  Component Value Date   WBC 12.9 (H) 06/21/2016   HGB 13.1 06/21/2016   HCT 37.1 (L) 06/21/2016   MCV 84.3 06/21/2016   PLT 204 06/21/2016     Assessment/Plan:  1 Day Post-Op   - Expected postop acute blood loss anemia - will monitor for symptoms - Up with PT/OT - DVT ppx - SCDs, ambulation, aspirin - NWB operative extremity - Pain control - Discharge planning - home today  Glee ArvinMichael Rutger Salton 06/23/2016, 7:31 AM [phone] [phone]

## 2016-06-23 NOTE — Progress Notes (Signed)
Orthopedic Tech Progress Note Patient Details:  Kern Reapdam Rathbone 1995-10-28 [id number]  Ortho Devices Type of Ortho Device: Crutches Splint Material: Plaster Ortho Device/Splint Location: RLE Ortho Device/Splint Interventions: Application   Saul FordyceJennifer C Soma Lizak 06/23/2016, 10:03 AM

## 2016-06-23 NOTE — Progress Notes (Signed)
Discharge instructions given. Pt verbalized understanding and all questions were answered.  

## 2016-06-23 NOTE — Evaluation (Addendum)
Occupational Therapy Evaluation/Discharge Patient Details Name: Luis Vega MRN: [id number] DOB: January 15, 1996 Today's Date: 06/23/2016    History of Present Illness 20 y.o.-year-old malewho was involved in a skateboarding accident with resulting righttibia/fibula fracture. Pt now s/p righttibia closed reduction and intramedullary nailingand closed treatment of left fibular shaft fracture with manipulation. PMH: Lt wrist fx.    Clinical Impression   PTA, pt was independent with ADL and IADL and working in [employer]. Pt currently requires supervision for shower transfers and LB ADL. He demonstrates modified independence with all other ADL. Pt lives with his sister who will be able to provide intermittent supervision. Completed all necessary education concerning safety during ADL while adhering to NWB RLE precautions and fall prevention. Pt reports no further questions/concerns. Recommend D/C home with intermittent supervision. He would benefit from shower seat to improve safety with ADL post-acute D/C. Pt has no further acute OT needs and no OT follow-up recommended. OT will sign off.     Follow Up Recommendations  No OT follow up;Supervision/Assistance - 24 hour    Equipment Recommendations  Tub/shower seat       Precautions / Restrictions Precautions Precautions: Fall Required Braces or Orthoses: Other Brace/Splint Other Brace/Splint: CAM boot Restrictions Weight Bearing Restrictions: Yes RLE Weight Bearing: Non weight bearing      Mobility Bed Mobility               General bed mobility comments: out of bed on OT arrival  Transfers Overall transfer level: Needs assistance Equipment used: Crutches Transfers: Sit to/from Stand Sit to Stand: Modified independent (Device/Increase time)              Balance Overall balance assessment: Needs assistance Sitting-balance support: No upper extremity supported Sitting balance-Leahy Scale: Normal     Standing  balance support: Single extremity supported Standing balance-Leahy Scale: Fair                              ADL Overall ADL's : Needs assistance/impaired     Grooming: Supervision/safety;Standing   Upper Body Bathing: Set up;Sitting   Lower Body Bathing: Sit to/from stand;Supervison/ safety   Upper Body Dressing : Set up;Sitting   Lower Body Dressing: Sit to/from stand;Supervision/safety   Toilet Transfer: Ambulation;BSC;Modified Independent (crutches)   Toileting- Clothing Manipulation and Hygiene: Sit to/from stand;Modified independent   Tub/ Shower Transfer: Supervision/safety;Walk-in shower;Shower seat;Ambulation (crutches)   Functional mobility during ADLs: Supervision/safety;Modified independent (crutches) General ADL Comments: Pt educated on safety with ADL while maintaining NWB RLE status, safe shower transfers, and fall prevention.     Vision Vision Assessment?: No apparent visual deficits (Wearing glasses)   Perception     Praxis      Pertinent Vitals/Pain Pain Assessment: 0-10 Pain Score: 1  Pain Location: rt lower leg Pain Descriptors / Indicators: Discomfort Pain Intervention(s): Monitored during session;Ice applied     Hand Dominance Right   Extremity/Trunk Assessment Upper Extremity Assessment Upper Extremity Assessment: Overall WFL for tasks assessed   Lower Extremity Assessment Lower Extremity Assessment: Defer to PT evaluation       Communication Communication Communication: No difficulties   Cognition Arousal/Alertness: Awake/alert Behavior During Therapy: WFL for tasks assessed/performed Overall Cognitive Status: Within Functional Limits for tasks assessed                     General Comments       Exercises       Shoulder Instructions  Home Living Family/patient expects to be discharged to:: Private residence Living Arrangements: Other relatives;Non-relatives/Friends (Younger sister and  roommate) Available Help at Discharge: Family;Available PRN/intermittently Type of Home: House Home Access: Stairs to enter Entergy CorporationEntrance Stairs-Number of Steps: 4 Entrance Stairs-Rails: None Home Layout: One level     Bathroom Shower/Tub: Tub/shower unit;Walk-in shower   Bathroom Toilet: Standard     Home Equipment: Grab bars - tub/shower;Hand held shower head;Crutches   Additional Comments: Pt lives with his sister who works during the day.       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        OT Problem List: Decreased strength;Decreased range of motion;Decreased activity tolerance;Impaired balance (sitting and/or standing);Decreased safety awareness;Decreased knowledge of use of DME or AE;Decreased knowledge of precautions;Pain   OT Treatment/Interventions:      OT Goals(Current goals can be found in the care plan section) Acute Rehab OT Goals Patient Stated Goal: get healed OT Goal Formulation: With patient Time For Goal Achievement: 07/07/16 Potential to Achieve Goals: Good  OT Frequency:     Barriers to D/C:            Co-evaluation              End of Session Equipment Utilized During Treatment: Gait belt (crutches)  Activity Tolerance: Patient tolerated treatment well Patient left: in chair;with call bell/phone within reach   Time: 1135-1154 OT Time Calculation (min): 19 min Charges:  OT General Charges $OT Visit: 1 Procedure OT Evaluation $OT Eval Moderate Complexity: 1 Procedure  Doristine SectionCharity A Talesha Ellithorpe, OTR/L [phone] 06/23/2016, 1:11 PM

## 2016-06-23 NOTE — Discharge Summary (Signed)
Physician Discharge Summary      Patient ID: Luis Vega Faught MRN: [id number] DOB/AGE: 1995/07/19 20 y.o.  Admit date: 06/21/2016 Discharge date: 06/23/2016  Admission Diagnoses:  <principal problem not specified>  Discharge Diagnoses:  Active Problems:   Displaced spiral fracture of shaft of right tibia, initial encounter for closed fracture   History reviewed. No pertinent past medical history.  Surgeries: Procedure(s): INTRAMEDULLARY (IM) NAIL TIBIAL on 06/21/2016 - 06/22/2016   Consultants (if any):   Discharged Condition: Improved  Hospital Course: Luis Vega Hemric is an 20 y.o. male who was admitted 06/21/2016 with a diagnosis of <principal problem not specified> and went to the operating room on 06/21/2016 - 06/22/2016 and underwent the above named procedures.    He was given perioperative antibiotics:  Anti-infectives    Start     Dose/Rate Route Frequency Ordered Stop   06/22/16 1600  ceFAZolin (ANCEF) IVPB 2g/100 mL premix     2 g 200 mL/hr over 30 Minutes Intravenous Every 6 hours 06/22/16 1534 06/23/16 0547   06/22/16 0912  ceFAZolin (ANCEF) 2-4 GM/100ML-% IVPB    Comments:  Kelly SplinterHawkins, Joshua   : cabinet override      06/22/16 0912 06/22/16 2114    .  He was given sequential compression devices, early ambulation, and aspirin for DVT prophylaxis.  He benefited maximally from the hospital stay and there were no complications.    Recent vital signs:  Vitals:   06/22/16 2311 06/23/16 0427  BP: 116/63 129/60  Pulse: (!) 45 (!) 52  Resp:    Temp:  98.3 F (36.8 C)    Recent laboratory studies:  Lab Results  Component Value Date   HGB 13.1 06/21/2016   Lab Results  Component Value Date   WBC 12.9 (H) 06/21/2016   PLT 204 06/21/2016   No results found for: INR Lab Results  Component Value Date   NA 137 06/21/2016   K 3.9 06/21/2016   CL 105 06/21/2016   CO2 25 06/21/2016   BUN 15 06/21/2016   CREATININE 0.96 06/21/2016   GLUCOSE 112 (H) 06/21/2016     Discharge Medications:     Medication List    TAKE these medications   aspirin EC 325 MG tablet Take 1 tablet (325 mg total) by mouth daily.   methocarbamol 750 MG tablet Commonly known as:  ROBAXIN Take 1 tablet (750 mg total) by mouth 2 (two) times daily as needed for muscle spasms.   ondansetron 4 MG tablet Commonly known as:  ZOFRAN Take 1-2 tablets (4-8 mg total) by mouth every 8 (eight) hours as needed for nausea or vomiting.   oxyCODONE 5 MG immediate release tablet Commonly known as:  Oxy IR/ROXICODONE Take 1-3 tablets (5-15 mg total) by mouth every 4 (four) hours as needed.   promethazine 25 MG tablet Commonly known as:  PHENERGAN Take 1 tablet (25 mg total) by mouth every 6 (six) hours as needed for nausea.   senna-docusate 8.6-50 MG tablet Commonly known as:  SENOKOT S Take 1 tablet by mouth at bedtime as needed.   sodium chloride 0.65 % Soln nasal spray Commonly known as:  OCEAN Place 1 spray into both nostrils daily.            Durable Medical Equipment        Start     Ordered   06/22/16 1535  DME Walker rolling  Once    Question:  Patient needs a walker to treat with the following condition  Answer:  History of open reduction and internal fixation (ORIF) procedure   06/22/16 1534   06/22/16 1535  DME 3 n 1  Once     06/22/16 1534   06/22/16 1535  DME Bedside commode  Once    Question:  Patient needs a bedside commode to treat with the following condition  Answer:  History of open reduction and internal fixation (ORIF) procedure   06/22/16 1534      Diagnostic Studies: Dg Tibia/fibula Right  Result Date: 06/22/2016 CLINICAL DATA:  Fracture fixation EXAM: DG C-ARM 61-120 MIN; RIGHT TIBIA AND FIBULA - 2 VIEW COMPARISON:  06/21/2016 FINDINGS: C-arm images in the operating room reveal intramedullary locking rod placement in the tibia. There is satisfactory alignment of the fracture the distal tibia. Nondisplaced fracture distal fibula without  surgical fixation IMPRESSION: Intramedullary rod placement in the tibia for fracture fixation. Electronically Signed   By: Marlan Palau M.D.   On: 06/22/2016 13:02   Dg Tibia/fibula Right  Result Date: 06/21/2016 CLINICAL DATA:  Initial evaluation for acute trauma, skateboarding accident. Pain and swelling at lower leg. EXAM: RIGHT TIBIA AND FIBULA - 2 VIEW COMPARISON:  None. FINDINGS: There are is an acute oblique fracture through the distal tibial shaft. Lateral displacement of nearly 1 shaft width. No significant angulation. Additional acute oblique fracture through the distal fibular shaft with lateral displacement. Overlying soft tissue swelling.  No soft tissue emphysema. IMPRESSION: Acute oblique fractures through the distal tibial and fibular shafts with lateral displacement. Electronically Signed   By: Rise Mu M.D.   On: 06/21/2016 22:35   Dg Ankle Complete Right  Result Date: 06/21/2016 CLINICAL DATA:  Initial evaluation for acute trauma, skateboarding accident. EXAM: RIGHT ANKLE - COMPLETE 3+ VIEW COMPARISON:  Comparison made with concomitant radiograph of the right tibia and fibula. FINDINGS: There is an acute oblique fracture through the distal tibial shaft with lateral displacement. Additional acute oblique fracture through the distal fibular shaft with lateral displacement. Ankle mortise remains approximated. Talar dome intact. No dislocation about the ankle self. Associated soft tissue swelling present within the lower leg he and about the ankle. IMPRESSION: 1. Acute oblique fracture through the distal tibial and fibular shaft with lateral displacement. 2. No other acute fracture about the ankle. Ankle joint remains approximated. Electronically Signed   By: Rise Mu M.D.   On: 06/21/2016 22:37   Ct Ankle Right Wo Contrast  Result Date: 06/22/2016 CLINICAL DATA:  Right ankle pain. Ankle fracture post skating event tonight. EXAM: CT OF THE RIGHT ANKLE WITHOUT  CONTRAST TECHNIQUE: Multidetector CT imaging of the right ankle was performed according to the standard protocol. Multiplanar CT image reconstructions were also generated. COMPARISON:  Radiographs 2 hours prior. FINDINGS: Bones/Joint/Cartilage Oblique displaced distal tibial diaphysis all fracture is mildly comminuted. There is approximately a 4 mm displacement. No extension to the tibial plafond. Oblique distal fibular diaphyseal fracture is slightly more distal than the tibial fracture however has greater displacement of 1 cm. Proximal fragment about the anterior tibial cortex distally. There is no intra-articular extension. Osteochondral defect of the medial talar dome appears chronic. The mortise is preserved. There is no tibial talar joint effusion. Ligaments Suboptimally assessed by CT. Muscles and Tendons No intramuscular hematoma. Muscle bulk is maintained. The Achilles tendon is intact. No evidence of flexor or extensor entrapment secondary to fractures. Soft tissues Soft tissue edema and probable hematoma anteriorly about the ankle. IMPRESSION: 1. Oblique displaced distal tibia and fibular shaft fractures without intra-articular extension. 2.  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 Electronically Signed   By: Rubye OaksMelanie  Ehinger M.D.   On: 06/22/2016 00:35   Dg Tibia/fibula Right Port  Result Date: 06/22/2016 CLINICAL DATA:  Post intramedullary rod fixation of the tibia. EXAM: PORTABLE RIGHT TIBIA AND FIBULA - 2 VIEW COMPARISON:  Right tibia and fibular radiographs - 06/21/2016; intraoperative radiographs of the right lower leg -06/22/2016 FINDINGS: Post intramedullary rod fixation of previously noted comminuted distal tibial fracture. The proximal end of the intramedullary rod is transfixed with a single cancellous screw well the distal end is transfixed with 2 cancellous screws. Alignment appears near anatomic. Marked improved alignment of previously noted comminuted distal fibular  fracture without dedicated ORIF. Skin staples are noted about the operative site both proximally and distally. There is a small amount of expected intra-articular air within the knee joint. IMPRESSION: Post intramedullary rod fixation of the tibia without evidence of complication. Electronically Signed   By: Simonne ComeJohn  Watts M.D.   On: 06/22/2016 13:31   Dg C-arm 1-60 Min  Result Date: 06/22/2016 CLINICAL DATA:  Fracture fixation EXAM: DG C-ARM 61-120 MIN; RIGHT TIBIA AND FIBULA - 2 VIEW COMPARISON:  06/21/2016 FINDINGS: C-arm images in the operating room reveal intramedullary locking rod placement in the tibia. There is satisfactory alignment of the fracture the distal tibia. Nondisplaced fracture distal fibula without surgical fixation IMPRESSION: Intramedullary rod placement in the tibia for fracture fixation. Electronically Signed   By: Marlan Palauharles  Clark M.D.   On: 06/22/2016 13:02    Disposition: Final discharge disposition not confirmed  Discharge Instructions    Call MD / Call 911    Complete by:  As directed    If you experience chest pain or shortness of breath, CALL 911 and be transported to the hospital emergency room.  If you develope a fever above 101.5 F, pus (white drainage) or increased drainage or redness at the wound, or calf pain, call your surgeon's office.   Constipation Prevention    Complete by:  As directed    Drink plenty of fluids.  Prune juice may be helpful.  You may use a stool softener, such as Colace (over the counter) 100 mg twice a day.  Use MiraLax (over the counter) for constipation as needed.   Driving restrictions    Complete by:  As directed    No driving while taking narcotic pain meds.   Increase activity slowly as tolerated    Complete by:  As directed       Follow-up Information    Glee ArvinMichael Kenson Groh, MD In 2 weeks.   Specialty:  Orthopedic Surgery Why:  For suture removal, For wound re-check Contact information: [address] [place]  [id number] [phone]            Signed: Glee ArvinMichael Berneda Piccininni 06/23/2016, 7:32 AM

## 2016-06-23 NOTE — Progress Notes (Signed)
Physical Therapy Treatment Patient Details Name: Luis Vega MRN: [id number] DOB: May 04, 1996 Today's Date: 06/23/2016    History of Present Illness 20 y.o.-year-old malewho was involved in a skateboarding accident with resulting righttibia/fibula fracture. Pt now s/p righttibia closed reduction and intramedullary nailingand closed treatment of left fibular shaft fracture with manipulation. PMH: Lt wrist fx.     PT Comments    Pt mobilizing well during PT session, anticipate pt will be able to D/C home with his sister once released. Pt requests using crutches for mobility. PT to follow acutely. Patient denies any questions or concerns.    Follow Up Recommendations  No PT follow up;Supervision - Intermittent     Equipment Recommendations  Crutches    Recommendations for Other Services       Precautions / Restrictions Precautions Precautions: Fall Required Braces or Orthoses: Other Brace/Splint Other Brace/Splint: CAM boot Restrictions Weight Bearing Restrictions: Yes RLE Weight Bearing: Non weight bearing    Mobility  Bed Mobility Overal bed mobility: Independent             General bed mobility comments: supine to sit  Transfers Overall transfer level: Needs assistance Equipment used: Crutches Transfers: Sit to/from Stand Sit to Stand: Modified independent (Device/Increase time)         General transfer comment: reviewed technique, good stability demonstrated.   Ambulation/Gait Ambulation/Gait assistance: Supervision Ambulation Distance (Feet): 200 Feet Assistive device: Crutches Gait Pattern/deviations:  (swing-to pattern) Gait velocity: decreased   General Gait Details: good stability, improving pattern with practice   Stairs Stairs: Yes   Stair Management: No rails;With crutches Number of Stairs: 12 General stair comments: guard for safety  Wheelchair Mobility    Modified Rankin (Stroke Patients Only)       Balance Overall balance  assessment: Needs assistance Sitting-balance support: No upper extremity supported Sitting balance-Leahy Scale: Normal     Standing balance support: Single extremity supported Standing balance-Leahy Scale: Fair                      Cognition Arousal/Alertness: Awake/alert Behavior During Therapy: WFL for tasks assessed/performed Overall Cognitive Status: Within Functional Limits for tasks assessed                      Exercises      General Comments        Pertinent Vitals/Pain Pain Assessment: No/denies pain Pain Intervention(s): Monitored during session    Home Living                      Prior Function            PT Goals (current goals can now be found in the care plan section) Acute Rehab PT Goals Patient Stated Goal: get healed PT Goal Formulation: With patient Time For Goal Achievement: 07/06/16 Potential to Achieve Goals: Good Progress towards PT goals: Progressing toward goals    Frequency    Min 3X/week      PT Plan Current plan remains appropriate    Co-evaluation             End of Session Equipment Utilized During Treatment: Gait belt (cam boot) Activity Tolerance: Patient tolerated treatment well Patient left: in chair;with call bell/phone within reach (ice and elevation)     Time: [id number] PT Time Calculation (min) (ACUTE ONLY): 29 min  Charges:  $Gait Training: 23-37 mins  G Codes:      Christiane HaBenjamin J. Isatu Macinnes, PT, CSCS Pager [phone] Office [phone]  06/23/2016, 9:12 AM

## 2016-06-24 NOTE — Anesthesia Postprocedure Evaluation (Signed)
Anesthesia Post Note  Patient: Kern Reapdam Kiener  Procedure(s) Performed: Procedure(s) (LRB): INTRAMEDULLARY (IM) NAIL TIBIAL (Right)  Patient location during evaluation: PACU Anesthesia Type: General Level of consciousness: awake and alert Pain management: pain level controlled Vital Signs Assessment: post-procedure vital signs reviewed and stable Respiratory status: spontaneous breathing, nonlabored ventilation, respiratory function stable and patient connected to nasal cannula oxygen Cardiovascular status: blood pressure returned to baseline and stable Postop Assessment: no signs of nausea or vomiting Anesthetic complications: no    Last Vitals:  Vitals:   06/22/16 2311 06/23/16 0427  BP: 116/63 129/60  Pulse: (!) 45 (!) 52  Resp:    Temp:  36.8 C    Last Pain:  Vitals:   06/23/16 1543  TempSrc:   PainSc: 6                  Enrica Corliss S

## 2016-06-25 ENCOUNTER — Encounter (HOSPITAL_COMMUNITY): Payer: Self-pay | Admitting: Orthopaedic Surgery

## 2016-07-15 ENCOUNTER — Ambulatory Visit (INDEPENDENT_AMBULATORY_CARE_PROVIDER_SITE_OTHER): Payer: Self-pay

## 2016-07-15 ENCOUNTER — Encounter (INDEPENDENT_AMBULATORY_CARE_PROVIDER_SITE_OTHER): Payer: Self-pay | Admitting: Orthopaedic Surgery

## 2016-07-15 ENCOUNTER — Ambulatory Visit (INDEPENDENT_AMBULATORY_CARE_PROVIDER_SITE_OTHER): Payer: Self-pay | Admitting: Orthopaedic Surgery

## 2016-07-15 DIAGNOSIS — S82241A Displaced spiral fracture of shaft of right tibia, initial encounter for closed fracture: Secondary | ICD-10-CM

## 2016-07-15 DIAGNOSIS — S82241S Displaced spiral fracture of shaft of right tibia, sequela: Secondary | ICD-10-CM

## 2016-07-15 NOTE — Progress Notes (Signed)
3 weeks s/p right IM tibial nail.  Denies pain.  Doing well. Incisions c/d/i.  Mild swelling of foot.  xrays show stable fixation.  Continue NWB for 3 more weeks.  F/u 3 weeks with repeat tib fib xrays.  Anticipate advance him to WBAT at that time.  Staples removed today.

## 2016-08-05 ENCOUNTER — Ambulatory Visit (INDEPENDENT_AMBULATORY_CARE_PROVIDER_SITE_OTHER): Payer: Self-pay | Admitting: Orthopaedic Surgery

## 2016-08-08 ENCOUNTER — Ambulatory Visit (INDEPENDENT_AMBULATORY_CARE_PROVIDER_SITE_OTHER): Payer: Self-pay

## 2016-08-08 ENCOUNTER — Ambulatory Visit (INDEPENDENT_AMBULATORY_CARE_PROVIDER_SITE_OTHER): Payer: Self-pay | Admitting: Orthopaedic Surgery

## 2016-08-08 ENCOUNTER — Encounter (INDEPENDENT_AMBULATORY_CARE_PROVIDER_SITE_OTHER): Payer: Self-pay | Admitting: Orthopaedic Surgery

## 2016-08-08 DIAGNOSIS — S82241A Displaced spiral fracture of shaft of right tibia, initial encounter for closed fracture: Secondary | ICD-10-CM

## 2016-08-08 NOTE — Progress Notes (Signed)
Luis Vega is about 7 weeks status post IM nailing of right tibia fracture. He denies significant pain. He started weightbearing about a week ago. Physical exam shows well-healed surgical scars. Mild swelling of the lower leg. No knee effusion. Good range of motion ankle and knee. X-ray show stable fixation with early signs of healing. Advanced to weight-bear as tolerated in cam walker. [facility]. Follow-up in 6 weeks with repeat 2 view x-rays of the right tib-fib

## 2016-08-26 ENCOUNTER — Telehealth (INDEPENDENT_AMBULATORY_CARE_PROVIDER_SITE_OTHER): Payer: Self-pay | Admitting: Orthopaedic Surgery

## 2016-08-26 NOTE — Telephone Encounter (Signed)
See message below °

## 2016-08-26 NOTE — Telephone Encounter (Signed)
Patient called advised he is applying for food stamps and need a note written to social services stating that he had surgery and how long was his recovery time.  The number to contact patient is [phone]

## 2016-08-26 NOTE — Telephone Encounter (Signed)
Is this okay?

## 2016-08-26 NOTE — Telephone Encounter (Signed)
Sure.  His recovery is 6 months

## 2016-08-27 NOTE — Telephone Encounter (Signed)
Letter ready for pick up at the front desk. Called pt to let him know. LMOM

## 2016-09-18 ENCOUNTER — Ambulatory Visit (INDEPENDENT_AMBULATORY_CARE_PROVIDER_SITE_OTHER): Payer: Self-pay | Admitting: Orthopaedic Surgery

## 2018-11-12 IMAGING — CR DG TIBIA/FIBULA PORT 2V*R*
3 series · 3 of 3 positions shown · non-contrast
Comparison: Right tibia and fibular radiographs - 06/21/2016;
intraoperative radiographs of the right lower leg -06/22/2016

CLINICAL DATA: Post intramedullary rod fixation of the tibia.

EXAM:
PORTABLE RIGHT TIBIA AND FIBULA - 2 VIEW

[AP (1 of 2)]
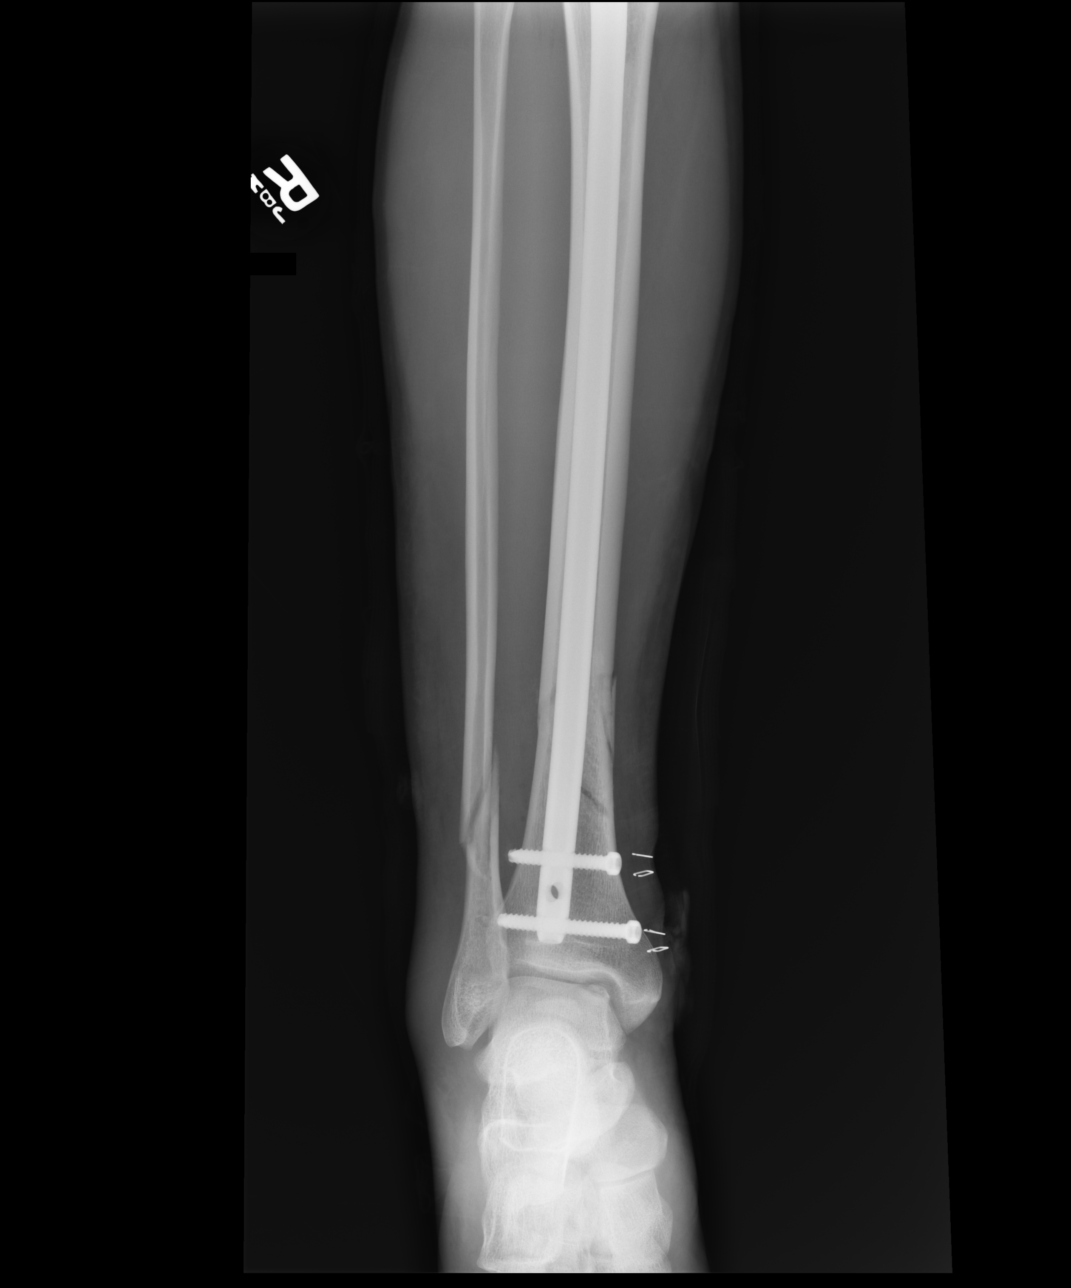

[lateral]
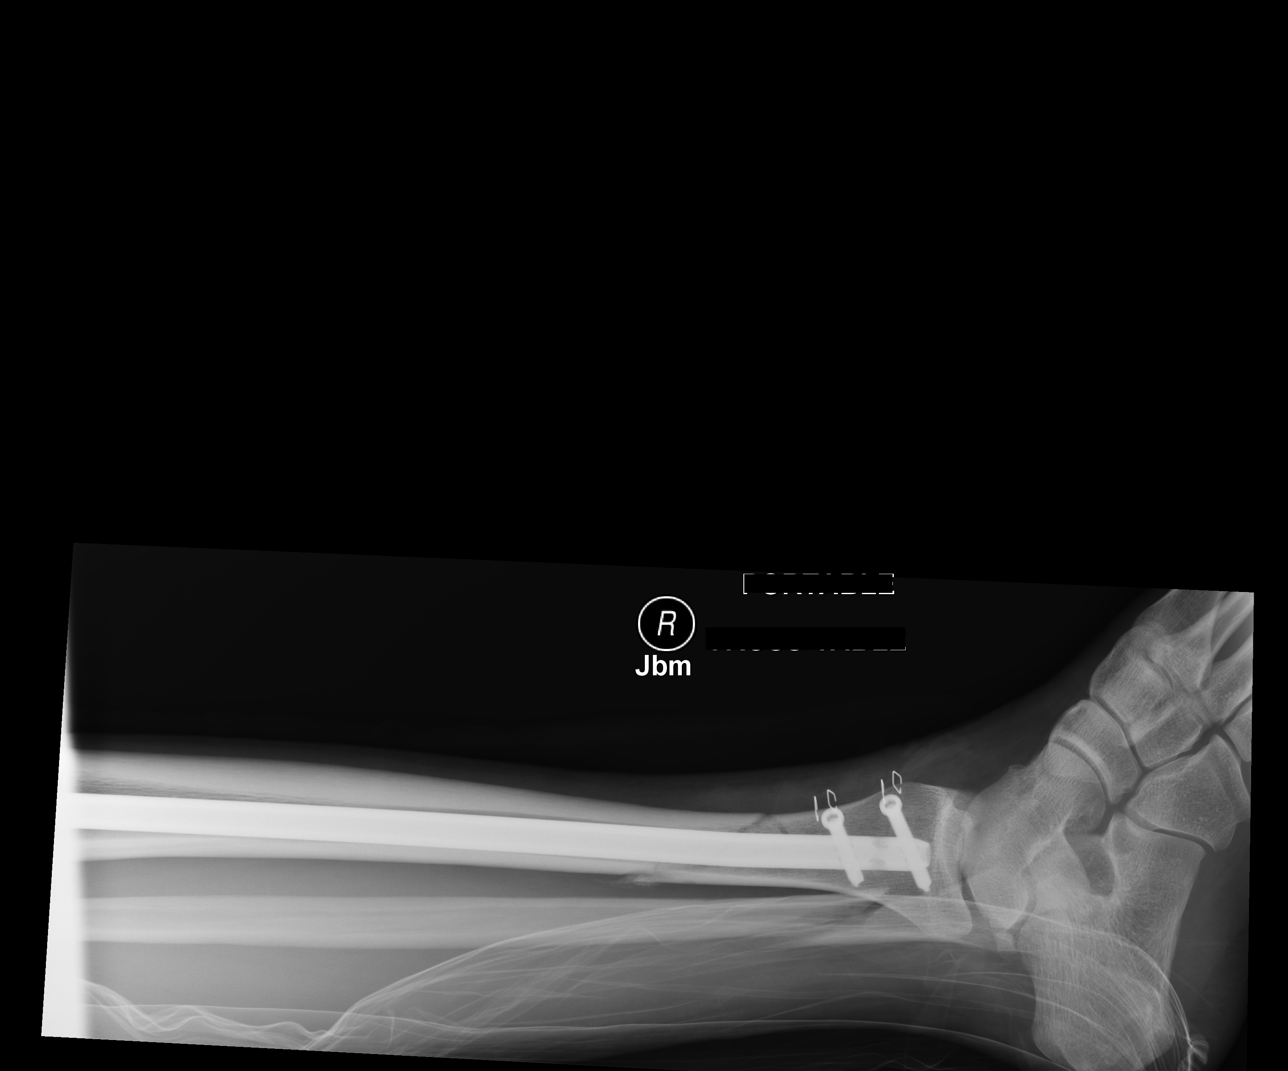

[AP (2 of 2)]
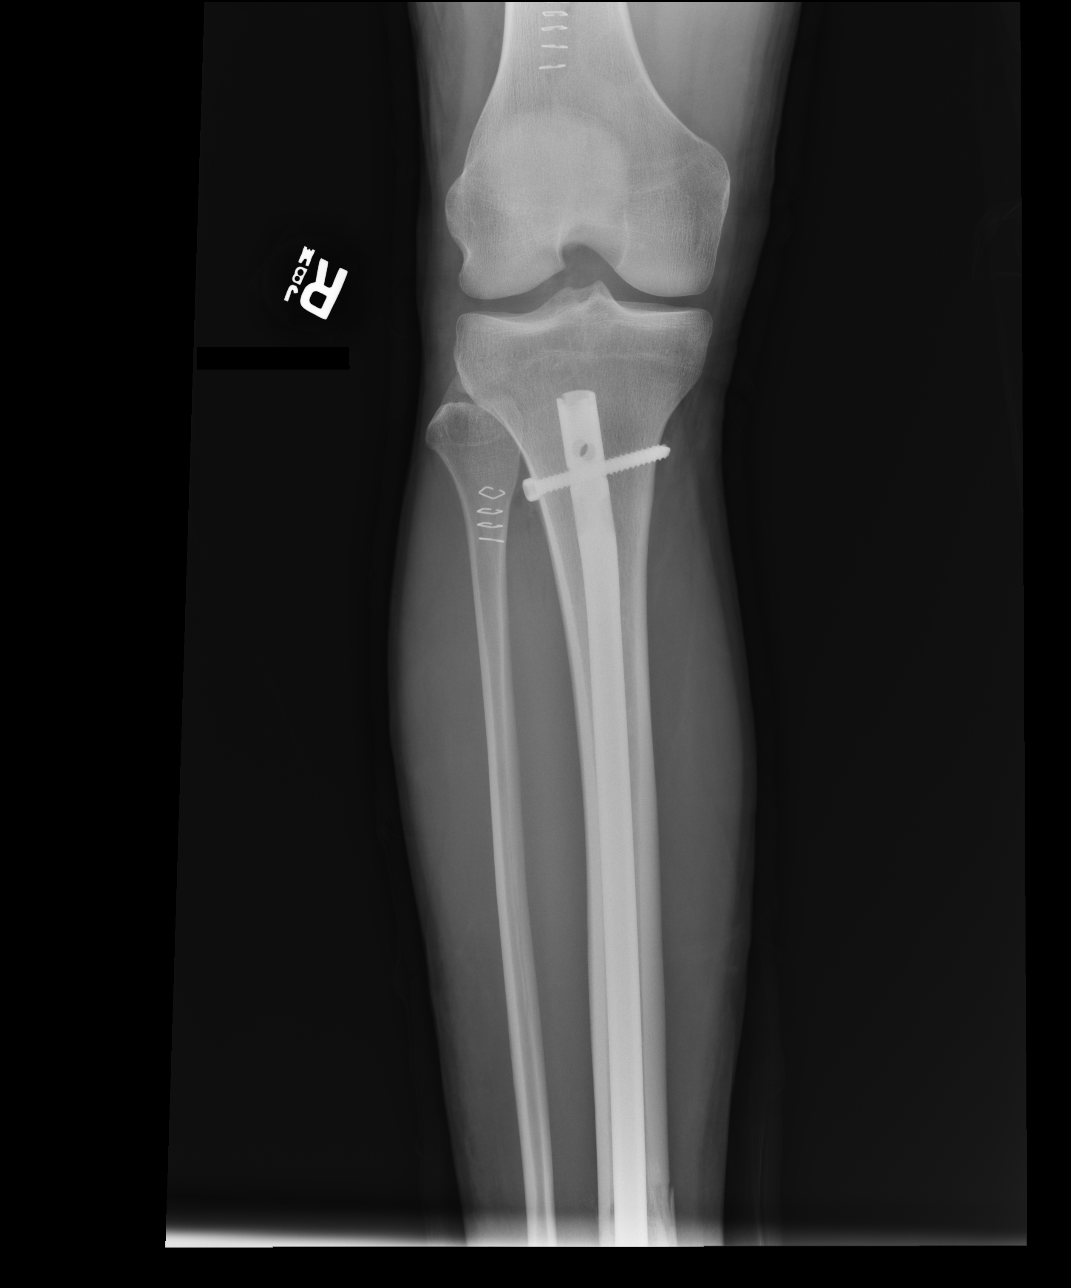

[3 of 3 positions shown; findings below may reference images not displayed]

FINDINGS: Post intramedullary rod fixation of previously noted comminuted
distal tibial fracture. The proximal end of the intramedullary rod
is transfixed with a single cancellous screw well the distal end is
transfixed with 2 cancellous screws. Alignment appears near
anatomic.

Marked improved alignment of previously noted comminuted distal
fibular fracture without dedicated ORIF.

Skin staples are noted about the operative site both proximally and
distally. There is a small amount of expected intra-articular air
within the knee joint.
IMPRESSION: Post intramedullary rod fixation of the tibia without evidence of
complication.
